# Patient Record
Sex: Male | Born: 1950 | Race: White | Hispanic: No | Marital: Married
Health system: Southern US, Community
[De-identification: ages and names within clinical notes are randomized; demographics above are authoritative.]

## PROBLEM LIST (undated history)

## (undated) DIAGNOSIS — I10 Essential (primary) hypertension: Secondary | ICD-10-CM

---

## 2005-05-14 ENCOUNTER — Inpatient Hospital Stay (HOSPITAL_COMMUNITY): Admission: EM | Admit: 2005-05-14 | Discharge: 2005-05-19 | Payer: Self-pay | Admitting: Emergency Medicine

## 2005-05-14 ENCOUNTER — Ambulatory Visit: Payer: Self-pay | Admitting: Hospitalist

## 2005-05-22 ENCOUNTER — Emergency Department (HOSPITAL_COMMUNITY): Admission: EM | Admit: 2005-05-22 | Discharge: 2005-05-23 | Payer: Self-pay | Admitting: Emergency Medicine

## 2005-05-23 ENCOUNTER — Inpatient Hospital Stay (HOSPITAL_COMMUNITY): Admission: AD | Admit: 2005-05-23 | Discharge: 2005-05-26 | Payer: Self-pay | Admitting: Internal Medicine

## 2005-05-23 ENCOUNTER — Ambulatory Visit: Payer: Self-pay | Admitting: Internal Medicine

## 2005-05-24 ENCOUNTER — Encounter (INDEPENDENT_AMBULATORY_CARE_PROVIDER_SITE_OTHER): Payer: Self-pay | Admitting: *Deleted

## 2005-05-30 ENCOUNTER — Ambulatory Visit: Payer: Self-pay | Admitting: Hospitalist

## 2005-06-02 ENCOUNTER — Ambulatory Visit (HOSPITAL_COMMUNITY): Admission: RE | Admit: 2005-06-02 | Discharge: 2005-06-02 | Payer: Self-pay | Admitting: *Deleted

## 2005-06-06 ENCOUNTER — Ambulatory Visit: Payer: Self-pay | Admitting: Hospitalist

## 2005-06-06 ENCOUNTER — Ambulatory Visit (HOSPITAL_COMMUNITY): Admission: RE | Admit: 2005-06-06 | Discharge: 2005-06-06 | Payer: Self-pay | Admitting: Hospitalist

## 2005-06-13 ENCOUNTER — Ambulatory Visit: Payer: Self-pay | Admitting: Internal Medicine

## 2005-06-27 ENCOUNTER — Ambulatory Visit: Payer: Self-pay | Admitting: Hospitalist

## 2006-03-27 IMAGING — PT NM PET TUM IMG SKULL BASE T - THIGH
4 series · 25 of 25 positions shown · non-contrast
Comparison: The patient had a CT scan on 05/23/05 with a history of pulmonary emboli.

CLINICAL DATA: Patient has a pulmonary nodule in the right middle lobe.
 FDG PET-CT TUMOR IMAGING (SKULL BASE TO THIGHS):
 Fasting Blood Glucose:  109.
TECHNIQUE: 16.8 mCi F-18 FDG were administered via the right hand.  Full ring PET imaging was performed from the skull base through the mid-thighs 48 minutes after injection.  CT data was obtained and used for attenuation correction and anatomic localization only.  (This was not acquired as a diagnostic CT examination).

[Series 1: pet ac · axial · 3.3mm · 4.69mm/px · z∈[-870,+0]mm · 8 of 267 slices shown]
[im 1/267]
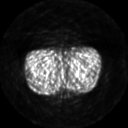
[im 39/267]
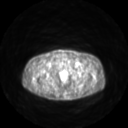
[im 77/267]
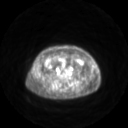
[im 115/267]
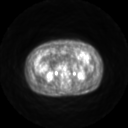
[im 153/267]
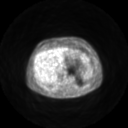
[im 191/267]
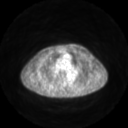
[im 229/267]
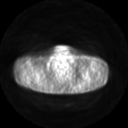
[im 267/267]
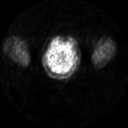

[Series 2: pet nac · axial · 3.3mm · 4.69mm/px · z∈[-870,+0]mm · 8 of 267 slices shown]
[im 1/267]
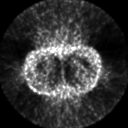
[im 39/267]
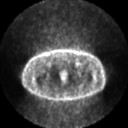
[im 77/267]
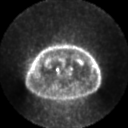
[im 115/267]
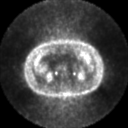
[im 153/267]
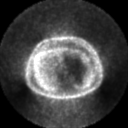
[im 191/267]
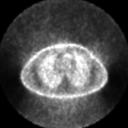
[im 229/267]
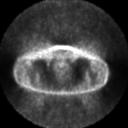
[im 267/267]
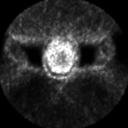

[Series 2: ct images · axial · 3.8mm · 0.98mm/px · z∈[-870,+0]mm · 8 of 254 slices shown]
[im 1/254]
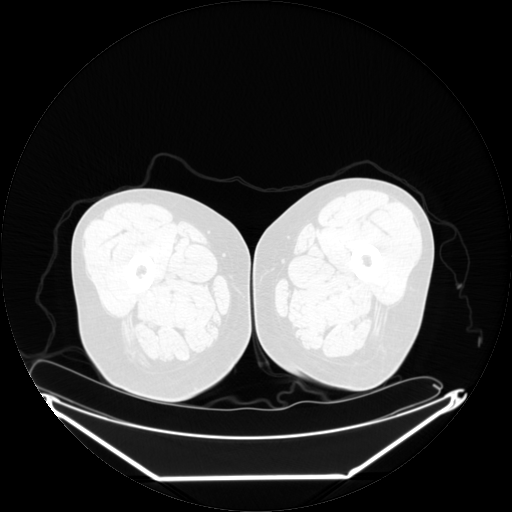
[im 37/254]
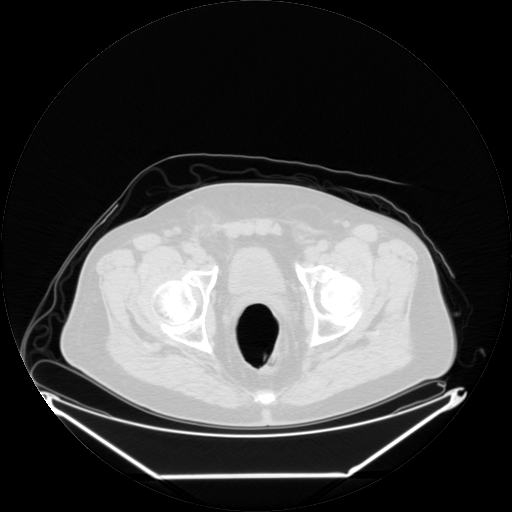
[im 73/254]
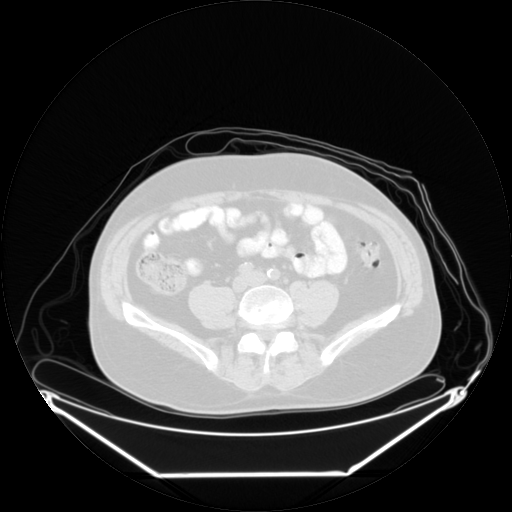
[im 109/254]
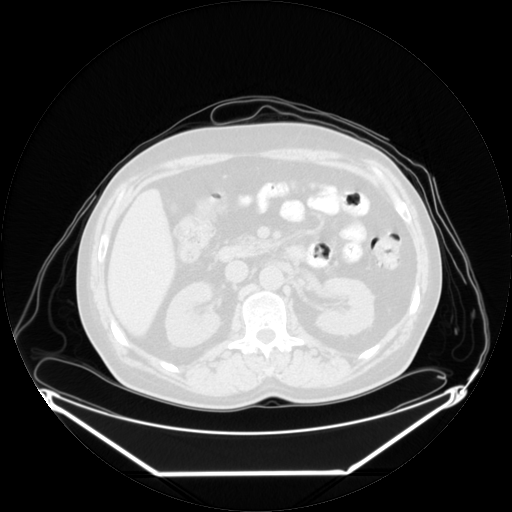
[im 145/254]
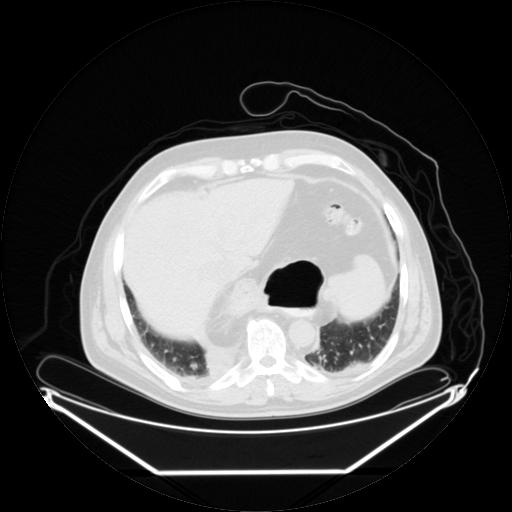
[im 181/254]
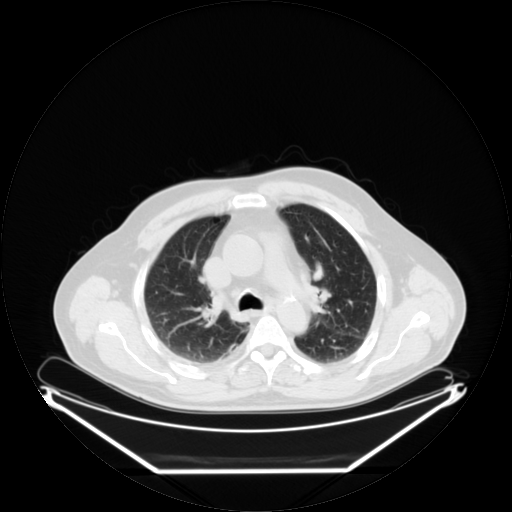
[im 217/254  brain]
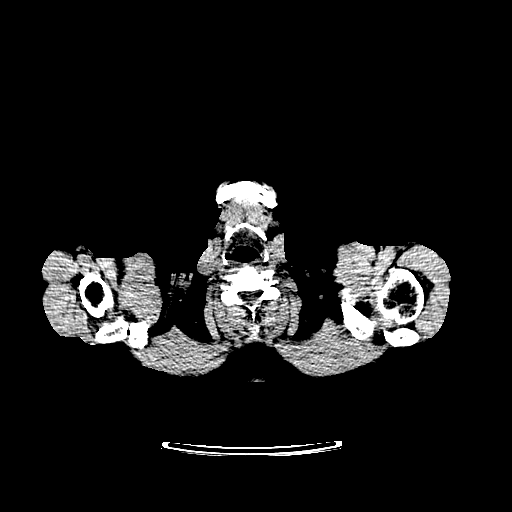
[im 254/254  brain]
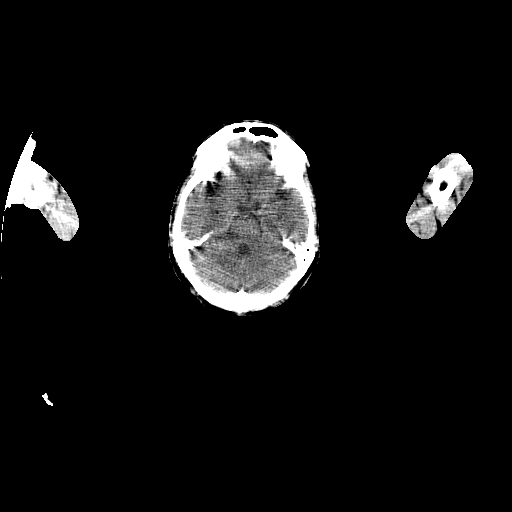

[Series 123: mip · coronal · 3.3mm · 4.69mm/px · 1 of 30 slices shown]
[im 1/30]
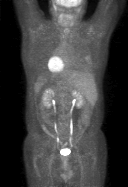

[25 of 25 positions shown; findings below may reference images not displayed]

A 13-mm nodule is noted in the lateral portion of the right middle lobe.  The patient had thoracentesis on 05/24/05.
FINDINGS: No abnormal activity in the neck.  There is mild focal increase in FDG activity in the lower pole of the left lobe of the thyroid.  No definite focal mass is noted on the unenhanced CT scan. 
 No abnormal activity is identified within the chest.  Particularly, no abnormal activity corresponds to the nodule in the lateral portion of the right mid lung field.  No hilar or mediastinal abnormal activity.  No abnormal activity in the liver or pelvis.  Large hiatal hernia is visualized.
IMPRESSION: 1.  No abnormal FDG activity corresponds to the 13-mm nodule in the lateral portion of the right mid lung field. 
 2.  There is minimal increase in FDG activity in the left lobe of the thyroid without a definite mass being noted on the CT scan.

## 2008-03-05 ENCOUNTER — Encounter: Admission: RE | Admit: 2008-03-05 | Discharge: 2008-03-05 | Payer: Self-pay | Admitting: Cardiology

## 2008-11-12 ENCOUNTER — Encounter: Admission: RE | Admit: 2008-11-12 | Discharge: 2008-11-12 | Payer: Self-pay | Admitting: Occupational Medicine

## 2008-11-26 ENCOUNTER — Encounter: Admission: RE | Admit: 2008-11-26 | Discharge: 2009-01-06 | Payer: Self-pay | Admitting: Family Medicine

## 2011-01-14 NOTE — Discharge Summary (Signed)
NAMEGHAZI, RUMPF NO.:  [id number]   MEDICAL RECORD NO.:  [id number]          PATIENT TYPE:  INP   LOCATION:  4705                         FACILITY:  [facility]   PHYSICIAN:  Eliseo Gum, M.D.   DATE OF BIRTH:  [birth year]   DATE OF ADMISSION:  05/23/2005  DATE OF DISCHARGE:  05/26/2005                                 DISCHARGE SUMMARY   CHIEF COMPLAINT:  Shortness of breath.   PRIMARY CARE PHYSICIAN:  [facility]   DISCHARGE DIAGNOSES:  1.  Right side pleural effusion.  2.  History of bilateral pulmonary embolus.  3.  Right middle lobe nodule.  4.  Hypothyroidism.   DISCHARGE MEDICATIONS:  1.  __________ 90 mg q. 12 hours.  2.  Levothyroxine 15 mcg daily.  3.  Avelox 400 mg daily x4 days.  4.  Protonix 40 mg daily.  5.  Warfarin 10 mg daily.  6.  Albuterol 2 puffs q.4 hours as needed.   Benjamin Shah was discharged from the hospital with good resolution of  shortness of breath. He was in the process of being anticoagulated with  Coumadin and is bridged with Lovenox. His pulmonary effusion was drained on  admission with 60 mL of fluid removed. He will followup as an outpatient for  further evaluation of his right middle lobe nodule. He will followup with  Dr. Guadlupe Spanish to further treat his anticoagulation requirement.   PROCEDURE:  Ultrasound guided thoracentesis with 60 mL of fluid removed. CT  angiogram on admission revealing right pleural effusion and right middle  lobe nodule. Chest x-ray following thoracentesis revealing resolution of  pleural effusion.   HISTORY OF PRESENT ILLNESS:  [name] is a 60 year old gentleman who  presented to hospital followup clinica with complaint of worsening shortness  of breath. The shortness of breath occurred after discharge from the  hospital four days prior whereupon he was treated for bilateral pneumonia  and bilateral pulmonary embolus. On presentation, it was determined that his  shortness of breath warranted  imaging and CT angiogram was obtained which  showed a pleural effusion. This pleural effusion was drained upon the  patient's admission.   PHYSICAL EXAMINATION:  VITAL SIGNS:  Temperature 98.4, pulse 73,  respirations 20, blood pressure 131/87, O2 saturations 94% on room air.  Breath sounds were decreased over the right lower lobe on exam.   Exam was otherwise unremarkable.   LABORATORY DATA ON ADMISSION:  White blood cells 7.2, hemoglobin 12.7,  hematocrit 38, platelets 627. Sodium 135, potassium 3.9, chloride 103. CO2  26, BUN 10, creatinine 0.9, glucose 120. Bilirubin 0.4, ALP 77, AST 28, ALT  29. Total protein 6.8, albumin 3.2, calcium 8.6. CK 41, CK-MB 0.3. Troponin  I is 0.02.   HOSPITAL COURSE BY PROBLEM:  #1.  RIGHT PLEURAL EFFUSION.  Ultrasound guided  thoracentesis was performed with 60 mL of fluid removed. Fluid was amber and  hazy with a white blood cell count of 12,300 and a glucose of 95, total  protein of 4.2, amylase 28. The fluid was determined to likely represent an  exudate with heavy eosinophilia present.  His exudate was determined also  likely not to be infectious but the patient was restarted on antibiotics in  the event that his pneumonia was still resolving. Over the course of this  hospitalization, the patient had no return of symptoms, no chest pain, no  shortness of breath.  #2.  HISTORY OF BILATERAL PULMONARY EMBOLI.  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 The  patient is currently receiving Coumadin 10 mg a day with Lovenox 90 mg twice  a day. Upon discharge, the patient's INR was 1.9. The patient will followup  in four days with Dr. Guadlupe Spanish __________. Goal INR will be 2.5 to 3.  #3.  RIGHT MIDDLE LOBE NODULE.  This nodule seen on CT angiogram measures  1.3 cm and was present on prior CT angiogram during prior hospitalization,  unchanged since that time. After conversations with CVTS, it was determined   that a PET scan would be the fastest way to rule out malignancy and would  preclude the necessity for surgical removal of this lesion in the near  future. We have scheduled PET scan at [facility] for October 5. The patient  will also followup in [facility] to evaluate the results of this PET scan.  #4.  HYPOTHYROIDISM.  This condition was diagnosed on admission during prior  hospitalization 2 weeks ago. The patient is now taking Synthroid and will  followup on TSH in clinic.      Judie Grieve, MD    ______________________________  Eliseo Gum, M.D.    SY/MEDQ  D:  05/26/2005  T:  05/27/2005  Job:  [id number]   cc:   Jaclyn Prime. Lucas Mallow, M.D.  Fax: [phone]   [facility]

## 2011-01-14 NOTE — Discharge Summary (Signed)
NAMEHILLARD, Benjamin Shah NO.:  [id number]   MEDICAL RECORD NO.:  [id number]          PATIENT TYPE:  INP   LOCATION:  2024                         FACILITY:  [facility]   PHYSICIAN:  Eliseo Gum, M.D.   DATE OF BIRTH:  06/13/1951   DATE OF ADMISSION:  05/14/2005  DATE OF DISCHARGE:  05/19/2005                                 DISCHARGE SUMMARY   DISCHARGE DIAGNOSES:  1.  Bilateral pulmonary emboli with hemoptysis and positive lupus      anticoagulant.  2.  Hypothyroidism.  3.  Pneumonia with pleural effusion.  4.  Normocytic anemia.  5.  Tobacco abuse.  6.  Heme-positive stools.   DISCHARGE MEDICATIONS:  1.  Coumadin 5 mg p.o. on Friday, 7.5 mg on Saturday, and 5 mg on Sunday      each at 6 p.m. in the evening.  2.  Synthroid 15 mcg p.o. daily.  3.  Albuterol metered dose inhaler two puffs q.4h. p.r.n.  4.  Lovenox 90 mg subcutaneous injections q.12h. Friday, Saturday and      Sunday, then stop.  5.  Levaquin 500 mg p.o. daily for seven days.  6.  Coumadin 5 mg p.o. daily and each day thereafter starting on Monday to      be dose adjusted by Dr. Michaell Cowing in the Coumadin clinic.   FOLLOW UP:  The patient is to follow up with Dr. Shannan Harper in the outpatient  clinic on Monday at 2:30 p.m. and Dr. Michaell Cowing in the Coumadin clinic on Monday  at 2 p.m. at which time the patient's Coumadin will be adjusted based on his  INR.  The patient's INR will need to be followed very closely and the  patient will likely need lifetime anticoagulation secondary to his being  positive for lupus anticoagulant.  The patient will also need a GI work-up  as an outpatient.  He is age-appropriate for a colonoscopy and also has  normocytic anemia as well as heme-positive stools.  However, he needs to be  on anticoagulation.  Therefore, the Coumadin and Lovenox were not stopped in  spite of the heme-positive stool.   CONDITION ON DISCHARGE:  The patient was discharged in stable and improved  condition.  His shortness of breath has greatly improved.  He is still  having hemoptysis. However, he had been oxygenating well including with  exercise.   OPERATION/PROCEDURE:  1.  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  Right basilar      atelectasis/infiltrate.  Small bilateral pleural effusions.  2.  Chest x-ray on May 15, 2005 showed bilateral pleural effusion,      right greater than left, with bilateral opacities, right greater than      left.  Cardiomegaly.  Large hiatal hernia.  3.  Chest x-ray on May 20, 2005 showed improving small pleural      effusion and peripheral air space opacities in the lung bases, right  greater than left.   CONSULTATIONS:  None.   ADMISSION HISTORY AND PHYSICAL:  Please see the chart for full details.  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  A week before admission  he drove from [place], [place] to [place], [place] where he stayed the  night.  He began experiencing sharp chest pain, a cough, shortness of  breath, and hemoptysis at that time.  The next day he went to the emergency  room in [place], [place] where he was diagnosed with pneumonia and received  prescriptions for antibiotics.  He then drove from [place] back to  [place] where the shortness of breath got worse and prompted him to go to  an Urgent Care Center.  They referred him to [facility] based on the chest x-  ray that was concerning for pulmonary embolus.   Past history is also noted for the fact that he experienced transient left  lower leg swelling and pain several weeks ago during a long cross-country  trip.  His present right-sided chest pain was  sharp and worsened with  movement.  The pain is not recreated with palpation.  The patient had  improved somewhat since the week prior and his hemoptysis has been fairly  unchanged since it began with bright red blood at night.   PHYSICAL EXAMINATION:  VITAL SIGNS:  Temperature 99.1, blood pressure  114/78, pulse 88, respirations 24, oxygen saturation 90% on room air which  increased to 95% on 4 L of oxygen nasal cannula.  GENERAL:  He was resting comfortably in no acute distress.  HEENT:  Extraocular movements were intact.  Pupils were equally round and  reactive to light and accommodation. ENT and oropharynx showed a left  tonsillar red, raised area.  NECK:  Supple with no thyromegaly.  LUNGS:  On the right side had rhonchi with decreased breath sounds over  right middle and lower lobes and the left lower lobe.  However, no wheezing  or crackles were noted.  CARDIOVASCULAR:  Regular rate and rhythm.  No murmurs.  ABDOMEN:  Positive bowel sounds.  Soft, nontender, nondistended.  EXTREMITIES:  2+ upper and lower extremity pulses.  No edema.  RECTAL:  Good rectal tone. No hemorrhoids.  Light brown stool guaiac-  positive.  SKIN:  No rashes lesions.  LYMPH NODES:  No cervical lymphadenopathy.  MUSCULOSKELETAL: 5/5 strength upper and lower extremities.  NEUROLOGIC:  Cranial nerves II-XII grossly intact.  Cycles appropriate.   LABORATORY DATA ON ADMISSION:  An ABG on 4 L per minute of oxygen via nasal  cannula showed a PH of 7.407, PCO2 39.3, PO2 96.7, bicarb 24.2.  Oxygen  saturation 97.5.  WBC 9.4, hemoglobin 11.5, hematocrit 34, MCV 89.5,  platelets 223.  Absolute neutrophils were 6.5.  Peripheral blood smear  showed slight toxic granulation, few vacuolated neutrophils, and few  atypical lymphocytes.  Percent retics 0.8, RBC 3.9, absolute retics 31.2.  PT 16.2, INR 1.3.  Antithrombin III was 74 which is low.  Factor V Leiden mutation was negative.  Lupus anticoagulant was detected.   Protein C was low  at 59.  Protein S was normal at 87.  Protein S functional was low at 55.  Protein C functional was low at 90.  Sodium 135, potassium 3.5, chloride  102, bicarb 26, glucose 97, BUN 4, creatinine 0.8, calcium 8.2, total  protein 6.3, albumin 2.8, AST 37, ALT 22, alk phos 57, total bilirubin 1.3,  magnesium 2.3, LDH 123, homocysteine level 9.38 and normal.  Hemoglobin A1c  5.5 and normal.  CK 27, CK-MB 0.4, troponin-I 0.01.  Fasting lipid panel  showed a cholesterol of 126, triglyceride 76, HDL 24, LDL 87, TSH 26.287,  free T4 and repeated TSH was 19.69, free T4 0.85.  Iron studies showed an  iron of 12, TIBC 282, percent saturation 4, B12 374, ferritin 147, IBC  folate 320.  Haptoglobin was 91.  Blood cultures drawn on the day of  admission showed no growth for 5 days.  Sputum cultures grew normal  pharyngeal flora.  Cardiolipin antibody was 21 and normal.  B2GLY, IgG/IgM  were normal.  Prothrombin 2 gene mutation was negative.   HOSPITAL COURSE:  Problem 1. Bilateral pulmonary emboli with hemoptysis and  positive lupus anticoagulation:  Given the patient's symptoms on  presentation, he had a high pretest probability of having a pulmonary  embolus and it was shown on CT angio on his chest that he had bilateral  pulmonary emboli with most likely resultant lower lobe pneumonia with  pleural effusion secondary also to the pulmonary emboli.  The patient's  hypocoagulable studies are listed previously under the history and physical  labs.  However, in summary the patient was found to be lupus anticoagulation  positive. Therefore, will likely need lifetime anticoagulation with  Coumadin.  The patient was started on low-molecular weight heparin on the  day of admission as well as warfarin and was continued on the low-molecular  weight heparin as a bridge until the INR was therapeutic on the Coumadin.  The patient's shortness of breath, pleuritic chest pain and hemoptysis  slowly  improved as his hospital course went on.  On the day of discharge he  was saturating well on room air and was able to walk around on the floor  without dropping his oxygen saturation levels.  He was discharged with a  prescription for and instructions for injecting himself with Lovenox, low-  molecular weight heparin every 12 hours through the weekend with appropriate  instructions to continue taking his Coumadin and will be seen on Monday in  the outpatient clinic and the Coumadin clinic for close followup on his INR  and levels.  The patient continued to have hemoptysis until the day of  discharge.  However, it had vastly improved since the day of admission, and  he was no longer short of breath and he was receiving approximately  treatment for his pulmonary emboli and was, therefore, discharged with close  followup three days after being discharge.   Problem 2. Hypothyroidism:  The patient did not carry a previous history of hypothyroidism.  This was discovered during this hospitalization.  The TSH  was checked initially and found to be high.  It was repeated and found to be  high still with a low free T4.  Therefore, the diagnosis of hypothyroidism  was made and the patient was started on Synthroid at 50 mcg p.o. daily.  This should be followed as an outpatient and his dose of Synthroid should be  adjusted as needed according to the TSH and free T4 levels.   Problem 3. Pneumonia with pleural effusion:  This is most likely secondary  to the pulmonary embolus infarcting tissue and allowing a nidus for  infection.  The patient was empirically started on antibiotics and was  discharged on a course of antibiotics to help  further resolve the pneumonia  seen on chest x-ray.  The patient remained afebrile during his  hospitalization and did not have a leukocytosis at all during this  hospitalization and was, therefore, discharged on Levaquin 500 mg p.o. daily  for seven more days to  empirically treat the pneumonia seen on chest x-ray.  A pleural effusion was also most likely secondary to the pulmonary emboli  and was slowly resolving as noted on his chest x-ray from May 18, 2005.  It may be considered to have a followup chest x-ray in the outpatient  setting to monitor the improvement of the air space opacities.   Problem 4. Normocytic anemia:  The patient did not carry a diagnosis of  anemia before being admitted to the hospital.  His hemoglobin was 11.5 on  admission but then MCV of 89.5.  His hemoglobin stayed stable in the 11-12  range.  He was found to be heme-positive on exam and his iron studies were  consistent with a possible iron-deficiency anemia and the patient should be  started on iron supplementation as an outpatient and should also be  scheduled for a colonoscopy as he is age-appropriate for iron-deficiency  anemia in order to rule out malignancy.  The patient's anticoagulation,  however, should be continued in light of the lupus anticoagulant being  positive with bilateral pleural effusions and the risk of him having another  PE completely outweighs the risk of anticoagulation.   Problem 5. Tobacco abuse:  The patient came in with a history of tobacco  abuse of about a pack a day for 35 years.  However, he had not smoked for  about a week prior to admission and did not smoke throughout his  hospitalization.  He received tobacco cessation counseling and was agreeable  to quitting and this should be followed as an outpatient and the patient  should be encouraged to continue to stop smoking.   Problem 6. Heme-positive stool:  This was found on the day of admission with  a rectal exam.  He was guaiac-positive and he does have an iron-deficiency  anemia and the patient is 60 years old.  Therefore, he is age-appropriate  for colonoscopy screening and this should be scheduled for the patient in  the outpatient setting.  DISCHARGE LABORATORY DATA:   On the day of discharge his white count was 6.3,  hemoglobin 11.4, hematocrit 33.4, MCV 88.7, platelets 428.  PT 26, INR 2.4.  Sodium 139, potassium 4.1, chloride 107, bicarb 26, glucose 100, BUN 4,  creatinine 0.8, calcium 8.5.  Please see the admission history and physical  lab section for a summary of all labs done during this hospitalization.   CONDITION ON DISCHARGE:  On discharge the patient's vital signs were stable.  His temperature was 97.8, pulse 79, respiratory rate 20, blood pressure  120/77 and he was satting at 96% on room air.   PENDING LABORATORY DATA:  None.      Chauncey Reading, M.D.    ______________________________  Eliseo Gum, M.D.    EA/MEDQ  D:  07/07/2005  T:  07/07/2005  Job:  [id number]   cc:   Judie Grieve, MD  Fax: [phone]

## 2012-05-23 ENCOUNTER — Ambulatory Visit: Payer: Self-pay

## 2012-05-23 ENCOUNTER — Other Ambulatory Visit: Payer: Self-pay | Admitting: Family Medicine

## 2012-05-23 DIAGNOSIS — M542 Cervicalgia: Secondary | ICD-10-CM

## 2012-05-30 ENCOUNTER — Ambulatory Visit: Payer: Worker's Compensation | Attending: Physician Assistant | Admitting: Physical Therapy

## 2012-05-30 DIAGNOSIS — M2560 Stiffness of unspecified joint, not elsewhere classified: Secondary | ICD-10-CM | POA: Insufficient documentation

## 2012-05-30 DIAGNOSIS — M6281 Muscle weakness (generalized): Secondary | ICD-10-CM | POA: Insufficient documentation

## 2012-05-30 DIAGNOSIS — M546 Pain in thoracic spine: Secondary | ICD-10-CM | POA: Insufficient documentation

## 2012-05-30 DIAGNOSIS — IMO0001 Reserved for inherently not codable concepts without codable children: Secondary | ICD-10-CM | POA: Insufficient documentation

## 2012-05-30 DIAGNOSIS — M542 Cervicalgia: Secondary | ICD-10-CM | POA: Insufficient documentation

## 2012-05-31 ENCOUNTER — Ambulatory Visit: Payer: Worker's Compensation | Attending: Physician Assistant | Admitting: Physical Therapy

## 2012-05-31 DIAGNOSIS — M546 Pain in thoracic spine: Secondary | ICD-10-CM | POA: Insufficient documentation

## 2012-05-31 DIAGNOSIS — M542 Cervicalgia: Secondary | ICD-10-CM | POA: Insufficient documentation

## 2012-05-31 DIAGNOSIS — M6281 Muscle weakness (generalized): Secondary | ICD-10-CM | POA: Insufficient documentation

## 2012-05-31 DIAGNOSIS — IMO0001 Reserved for inherently not codable concepts without codable children: Secondary | ICD-10-CM | POA: Insufficient documentation

## 2012-05-31 DIAGNOSIS — M2560 Stiffness of unspecified joint, not elsewhere classified: Secondary | ICD-10-CM | POA: Insufficient documentation

## 2012-06-05 ENCOUNTER — Ambulatory Visit: Payer: Worker's Compensation | Attending: Family Medicine | Admitting: Physical Therapy

## 2012-06-05 DIAGNOSIS — IMO0001 Reserved for inherently not codable concepts without codable children: Secondary | ICD-10-CM | POA: Insufficient documentation

## 2012-06-05 DIAGNOSIS — M6281 Muscle weakness (generalized): Secondary | ICD-10-CM | POA: Insufficient documentation

## 2012-06-05 DIAGNOSIS — M546 Pain in thoracic spine: Secondary | ICD-10-CM | POA: Insufficient documentation

## 2012-06-05 DIAGNOSIS — M2560 Stiffness of unspecified joint, not elsewhere classified: Secondary | ICD-10-CM | POA: Insufficient documentation

## 2012-06-05 DIAGNOSIS — M542 Cervicalgia: Secondary | ICD-10-CM | POA: Insufficient documentation

## 2012-06-06 ENCOUNTER — Other Ambulatory Visit: Payer: Self-pay | Admitting: Sports Medicine

## 2012-06-06 ENCOUNTER — Ambulatory Visit: Payer: Worker's Compensation | Admitting: Physical Therapy

## 2012-06-06 ENCOUNTER — Ambulatory Visit (INDEPENDENT_AMBULATORY_CARE_PROVIDER_SITE_OTHER): Payer: Worker's Compensation

## 2012-06-06 DIAGNOSIS — M546 Pain in thoracic spine: Secondary | ICD-10-CM

## 2012-06-06 DIAGNOSIS — X58XXXA Exposure to other specified factors, initial encounter: Secondary | ICD-10-CM

## 2012-06-08 ENCOUNTER — Ambulatory Visit: Payer: Worker's Compensation | Admitting: Physical Therapy

## 2012-06-11 ENCOUNTER — Ambulatory Visit: Payer: Worker's Compensation | Attending: Physician Assistant | Admitting: Physical Therapy

## 2012-06-11 DIAGNOSIS — M2560 Stiffness of unspecified joint, not elsewhere classified: Secondary | ICD-10-CM | POA: Insufficient documentation

## 2012-06-11 DIAGNOSIS — M546 Pain in thoracic spine: Secondary | ICD-10-CM | POA: Insufficient documentation

## 2012-06-11 DIAGNOSIS — IMO0001 Reserved for inherently not codable concepts without codable children: Secondary | ICD-10-CM | POA: Insufficient documentation

## 2012-06-11 DIAGNOSIS — M542 Cervicalgia: Secondary | ICD-10-CM | POA: Insufficient documentation

## 2012-06-11 DIAGNOSIS — M6281 Muscle weakness (generalized): Secondary | ICD-10-CM | POA: Insufficient documentation

## 2012-06-13 ENCOUNTER — Ambulatory Visit: Payer: Worker's Compensation | Admitting: Physical Therapy

## 2012-06-15 ENCOUNTER — Ambulatory Visit: Payer: Worker's Compensation | Attending: Physician Assistant | Admitting: Physical Therapy

## 2012-06-15 DIAGNOSIS — M546 Pain in thoracic spine: Secondary | ICD-10-CM | POA: Insufficient documentation

## 2012-06-15 DIAGNOSIS — M6281 Muscle weakness (generalized): Secondary | ICD-10-CM | POA: Insufficient documentation

## 2012-06-15 DIAGNOSIS — M542 Cervicalgia: Secondary | ICD-10-CM | POA: Insufficient documentation

## 2012-06-15 DIAGNOSIS — IMO0001 Reserved for inherently not codable concepts without codable children: Secondary | ICD-10-CM | POA: Insufficient documentation

## 2012-06-15 DIAGNOSIS — M2560 Stiffness of unspecified joint, not elsewhere classified: Secondary | ICD-10-CM | POA: Insufficient documentation

## 2012-06-18 ENCOUNTER — Ambulatory Visit: Payer: Worker's Compensation | Attending: Physician Assistant | Admitting: Physical Therapy

## 2012-06-18 DIAGNOSIS — M542 Cervicalgia: Secondary | ICD-10-CM | POA: Insufficient documentation

## 2012-06-18 DIAGNOSIS — M546 Pain in thoracic spine: Secondary | ICD-10-CM | POA: Insufficient documentation

## 2012-06-18 DIAGNOSIS — M2560 Stiffness of unspecified joint, not elsewhere classified: Secondary | ICD-10-CM | POA: Insufficient documentation

## 2012-06-18 DIAGNOSIS — M6281 Muscle weakness (generalized): Secondary | ICD-10-CM | POA: Insufficient documentation

## 2012-06-18 DIAGNOSIS — IMO0001 Reserved for inherently not codable concepts without codable children: Secondary | ICD-10-CM | POA: Insufficient documentation

## 2012-06-20 ENCOUNTER — Ambulatory Visit: Payer: Worker's Compensation | Attending: Physician Assistant

## 2012-06-20 DIAGNOSIS — M542 Cervicalgia: Secondary | ICD-10-CM | POA: Insufficient documentation

## 2012-06-20 DIAGNOSIS — M2560 Stiffness of unspecified joint, not elsewhere classified: Secondary | ICD-10-CM | POA: Insufficient documentation

## 2012-06-20 DIAGNOSIS — IMO0001 Reserved for inherently not codable concepts without codable children: Secondary | ICD-10-CM | POA: Insufficient documentation

## 2012-06-20 DIAGNOSIS — M6281 Muscle weakness (generalized): Secondary | ICD-10-CM | POA: Insufficient documentation

## 2012-06-20 DIAGNOSIS — M546 Pain in thoracic spine: Secondary | ICD-10-CM | POA: Insufficient documentation

## 2012-06-25 ENCOUNTER — Ambulatory Visit: Payer: Worker's Compensation | Admitting: Physical Therapy

## 2012-06-26 ENCOUNTER — Ambulatory Visit: Payer: Worker's Compensation | Attending: Physician Assistant | Admitting: Physical Therapy

## 2012-06-26 DIAGNOSIS — M542 Cervicalgia: Secondary | ICD-10-CM | POA: Insufficient documentation

## 2012-06-26 DIAGNOSIS — M546 Pain in thoracic spine: Secondary | ICD-10-CM | POA: Insufficient documentation

## 2012-06-26 DIAGNOSIS — M6281 Muscle weakness (generalized): Secondary | ICD-10-CM | POA: Insufficient documentation

## 2012-06-26 DIAGNOSIS — IMO0001 Reserved for inherently not codable concepts without codable children: Secondary | ICD-10-CM | POA: Insufficient documentation

## 2012-06-26 DIAGNOSIS — M2560 Stiffness of unspecified joint, not elsewhere classified: Secondary | ICD-10-CM | POA: Insufficient documentation

## 2012-06-27 ENCOUNTER — Encounter: Payer: Self-pay | Admitting: Physical Therapy

## 2012-07-02 ENCOUNTER — Ambulatory Visit: Payer: Worker's Compensation | Attending: Physician Assistant | Admitting: Physical Therapy

## 2012-07-02 DIAGNOSIS — M546 Pain in thoracic spine: Secondary | ICD-10-CM | POA: Insufficient documentation

## 2012-07-02 DIAGNOSIS — IMO0001 Reserved for inherently not codable concepts without codable children: Secondary | ICD-10-CM | POA: Insufficient documentation

## 2012-07-02 DIAGNOSIS — M6281 Muscle weakness (generalized): Secondary | ICD-10-CM | POA: Insufficient documentation

## 2012-07-02 DIAGNOSIS — M542 Cervicalgia: Secondary | ICD-10-CM | POA: Insufficient documentation

## 2012-07-02 DIAGNOSIS — M2560 Stiffness of unspecified joint, not elsewhere classified: Secondary | ICD-10-CM | POA: Insufficient documentation

## 2012-07-04 ENCOUNTER — Ambulatory Visit: Payer: Worker's Compensation | Attending: Physician Assistant | Admitting: Physical Therapy

## 2012-07-04 DIAGNOSIS — IMO0001 Reserved for inherently not codable concepts without codable children: Secondary | ICD-10-CM | POA: Insufficient documentation

## 2012-07-04 DIAGNOSIS — M546 Pain in thoracic spine: Secondary | ICD-10-CM | POA: Insufficient documentation

## 2012-07-04 DIAGNOSIS — M542 Cervicalgia: Secondary | ICD-10-CM | POA: Insufficient documentation

## 2012-07-04 DIAGNOSIS — M6281 Muscle weakness (generalized): Secondary | ICD-10-CM | POA: Insufficient documentation

## 2012-07-04 DIAGNOSIS — M2560 Stiffness of unspecified joint, not elsewhere classified: Secondary | ICD-10-CM | POA: Insufficient documentation

## 2012-07-09 ENCOUNTER — Encounter: Payer: Self-pay | Admitting: Physical Therapy

## 2012-07-11 ENCOUNTER — Encounter: Payer: Self-pay | Admitting: Physical Therapy

## 2012-07-16 ENCOUNTER — Encounter: Payer: Self-pay | Admitting: Physical Therapy

## 2013-01-31 ENCOUNTER — Ambulatory Visit: Payer: Worker's Compensation | Admitting: Physical Therapy

## 2013-01-31 DIAGNOSIS — M6281 Muscle weakness (generalized): Secondary | ICD-10-CM

## 2013-01-31 DIAGNOSIS — M2560 Stiffness of unspecified joint, not elsewhere classified: Secondary | ICD-10-CM

## 2013-01-31 DIAGNOSIS — M4804 Spinal stenosis, thoracic region: Secondary | ICD-10-CM

## 2013-01-31 DIAGNOSIS — M546 Pain in thoracic spine: Secondary | ICD-10-CM

## 2013-01-31 DIAGNOSIS — IMO0002 Reserved for concepts with insufficient information to code with codable children: Secondary | ICD-10-CM

## 2013-02-04 ENCOUNTER — Encounter: Payer: Worker's Compensation | Admitting: Physical Therapy

## 2013-02-04 DIAGNOSIS — M2560 Stiffness of unspecified joint, not elsewhere classified: Secondary | ICD-10-CM

## 2013-02-04 DIAGNOSIS — M4804 Spinal stenosis, thoracic region: Secondary | ICD-10-CM

## 2013-02-04 DIAGNOSIS — M6281 Muscle weakness (generalized): Secondary | ICD-10-CM

## 2013-02-04 DIAGNOSIS — M546 Pain in thoracic spine: Secondary | ICD-10-CM

## 2013-02-06 ENCOUNTER — Encounter: Payer: Worker's Compensation | Admitting: Physical Therapy

## 2013-02-06 DIAGNOSIS — M2560 Stiffness of unspecified joint, not elsewhere classified: Secondary | ICD-10-CM

## 2013-02-06 DIAGNOSIS — M4804 Spinal stenosis, thoracic region: Secondary | ICD-10-CM

## 2013-02-06 DIAGNOSIS — M546 Pain in thoracic spine: Secondary | ICD-10-CM

## 2013-02-06 DIAGNOSIS — IMO0002 Reserved for concepts with insufficient information to code with codable children: Secondary | ICD-10-CM

## 2013-02-06 DIAGNOSIS — M6281 Muscle weakness (generalized): Secondary | ICD-10-CM

## 2013-02-08 ENCOUNTER — Encounter: Payer: Worker's Compensation | Admitting: Physical Therapy

## 2013-02-08 ENCOUNTER — Encounter: Payer: Self-pay | Admitting: Physical Therapy

## 2013-02-08 DIAGNOSIS — M6281 Muscle weakness (generalized): Secondary | ICD-10-CM

## 2013-02-08 DIAGNOSIS — M4804 Spinal stenosis, thoracic region: Secondary | ICD-10-CM

## 2013-02-08 DIAGNOSIS — M2560 Stiffness of unspecified joint, not elsewhere classified: Secondary | ICD-10-CM

## 2013-02-08 DIAGNOSIS — M546 Pain in thoracic spine: Secondary | ICD-10-CM

## 2013-02-11 ENCOUNTER — Encounter: Payer: Self-pay | Admitting: Physical Therapy

## 2013-02-11 ENCOUNTER — Encounter: Payer: Worker's Compensation | Admitting: Physical Therapy

## 2013-02-11 DIAGNOSIS — M2560 Stiffness of unspecified joint, not elsewhere classified: Secondary | ICD-10-CM

## 2013-02-11 DIAGNOSIS — M4804 Spinal stenosis, thoracic region: Secondary | ICD-10-CM

## 2013-02-11 DIAGNOSIS — M546 Pain in thoracic spine: Secondary | ICD-10-CM

## 2013-02-11 DIAGNOSIS — M6281 Muscle weakness (generalized): Secondary | ICD-10-CM

## 2013-02-11 DIAGNOSIS — IMO0002 Reserved for concepts with insufficient information to code with codable children: Secondary | ICD-10-CM

## 2013-02-13 ENCOUNTER — Encounter: Payer: Worker's Compensation | Admitting: Physical Therapy

## 2013-02-13 ENCOUNTER — Encounter: Payer: Self-pay | Admitting: Physical Therapy

## 2013-02-13 DIAGNOSIS — IMO0002 Reserved for concepts with insufficient information to code with codable children: Secondary | ICD-10-CM

## 2013-02-13 DIAGNOSIS — M4804 Spinal stenosis, thoracic region: Secondary | ICD-10-CM

## 2013-02-13 DIAGNOSIS — M2560 Stiffness of unspecified joint, not elsewhere classified: Secondary | ICD-10-CM

## 2013-02-13 DIAGNOSIS — M6281 Muscle weakness (generalized): Secondary | ICD-10-CM

## 2013-02-13 DIAGNOSIS — M546 Pain in thoracic spine: Secondary | ICD-10-CM

## 2013-02-15 ENCOUNTER — Encounter: Payer: Self-pay | Admitting: Physical Therapy

## 2013-02-15 ENCOUNTER — Encounter: Payer: Worker's Compensation | Admitting: Physical Therapy

## 2013-02-15 DIAGNOSIS — M2560 Stiffness of unspecified joint, not elsewhere classified: Secondary | ICD-10-CM

## 2013-02-15 DIAGNOSIS — IMO0002 Reserved for concepts with insufficient information to code with codable children: Secondary | ICD-10-CM

## 2013-02-15 DIAGNOSIS — M4804 Spinal stenosis, thoracic region: Secondary | ICD-10-CM

## 2013-02-15 DIAGNOSIS — M546 Pain in thoracic spine: Secondary | ICD-10-CM

## 2013-02-15 DIAGNOSIS — M6281 Muscle weakness (generalized): Secondary | ICD-10-CM

## 2013-02-18 ENCOUNTER — Encounter: Payer: Self-pay | Admitting: Physical Therapy

## 2013-02-18 ENCOUNTER — Encounter: Payer: Worker's Compensation | Admitting: Physical Therapy

## 2013-02-18 DIAGNOSIS — M2560 Stiffness of unspecified joint, not elsewhere classified: Secondary | ICD-10-CM

## 2013-02-18 DIAGNOSIS — M546 Pain in thoracic spine: Secondary | ICD-10-CM

## 2013-02-18 DIAGNOSIS — IMO0002 Reserved for concepts with insufficient information to code with codable children: Secondary | ICD-10-CM

## 2013-02-18 DIAGNOSIS — M6281 Muscle weakness (generalized): Secondary | ICD-10-CM

## 2013-02-20 ENCOUNTER — Encounter: Payer: Worker's Compensation | Admitting: Physical Therapy

## 2013-02-20 ENCOUNTER — Encounter: Payer: Self-pay | Admitting: Physical Therapy

## 2013-02-20 DIAGNOSIS — M6281 Muscle weakness (generalized): Secondary | ICD-10-CM

## 2013-02-20 DIAGNOSIS — M2560 Stiffness of unspecified joint, not elsewhere classified: Secondary | ICD-10-CM

## 2013-02-20 DIAGNOSIS — M4804 Spinal stenosis, thoracic region: Secondary | ICD-10-CM

## 2013-02-20 DIAGNOSIS — IMO0002 Reserved for concepts with insufficient information to code with codable children: Secondary | ICD-10-CM

## 2013-02-20 DIAGNOSIS — M546 Pain in thoracic spine: Secondary | ICD-10-CM

## 2013-02-22 ENCOUNTER — Encounter: Payer: Self-pay | Admitting: Physical Therapy

## 2013-02-22 ENCOUNTER — Encounter: Payer: Worker's Compensation | Admitting: Physical Therapy

## 2013-02-22 DIAGNOSIS — IMO0002 Reserved for concepts with insufficient information to code with codable children: Secondary | ICD-10-CM

## 2013-02-22 DIAGNOSIS — M2560 Stiffness of unspecified joint, not elsewhere classified: Secondary | ICD-10-CM

## 2013-02-22 DIAGNOSIS — M546 Pain in thoracic spine: Secondary | ICD-10-CM

## 2013-02-22 DIAGNOSIS — M6281 Muscle weakness (generalized): Secondary | ICD-10-CM

## 2013-02-22 DIAGNOSIS — M4804 Spinal stenosis, thoracic region: Secondary | ICD-10-CM

## 2013-02-25 ENCOUNTER — Encounter: Payer: Self-pay | Admitting: Physical Therapy

## 2013-02-25 ENCOUNTER — Encounter: Payer: Worker's Compensation | Admitting: Physical Therapy

## 2013-02-25 DIAGNOSIS — IMO0002 Reserved for concepts with insufficient information to code with codable children: Secondary | ICD-10-CM

## 2013-02-25 DIAGNOSIS — M6281 Muscle weakness (generalized): Secondary | ICD-10-CM

## 2013-02-25 DIAGNOSIS — M546 Pain in thoracic spine: Secondary | ICD-10-CM

## 2013-02-25 DIAGNOSIS — M4804 Spinal stenosis, thoracic region: Secondary | ICD-10-CM

## 2013-02-25 DIAGNOSIS — M2560 Stiffness of unspecified joint, not elsewhere classified: Secondary | ICD-10-CM

## 2013-02-27 ENCOUNTER — Encounter: Payer: Self-pay | Admitting: Physical Therapy

## 2013-02-27 ENCOUNTER — Encounter: Payer: Worker's Compensation | Admitting: Physical Therapy

## 2013-02-27 DIAGNOSIS — M6281 Muscle weakness (generalized): Secondary | ICD-10-CM

## 2013-02-27 DIAGNOSIS — IMO0002 Reserved for concepts with insufficient information to code with codable children: Secondary | ICD-10-CM

## 2013-02-27 DIAGNOSIS — M4804 Spinal stenosis, thoracic region: Secondary | ICD-10-CM

## 2013-02-27 DIAGNOSIS — M546 Pain in thoracic spine: Secondary | ICD-10-CM

## 2013-02-27 DIAGNOSIS — M2560 Stiffness of unspecified joint, not elsewhere classified: Secondary | ICD-10-CM

## 2013-02-28 ENCOUNTER — Encounter: Payer: Worker's Compensation | Admitting: Physical Therapy

## 2013-02-28 ENCOUNTER — Encounter: Payer: Self-pay | Admitting: Physical Therapy

## 2013-02-28 DIAGNOSIS — M6281 Muscle weakness (generalized): Secondary | ICD-10-CM

## 2013-02-28 DIAGNOSIS — M546 Pain in thoracic spine: Secondary | ICD-10-CM

## 2013-02-28 DIAGNOSIS — IMO0002 Reserved for concepts with insufficient information to code with codable children: Secondary | ICD-10-CM

## 2013-02-28 DIAGNOSIS — M2560 Stiffness of unspecified joint, not elsewhere classified: Secondary | ICD-10-CM

## 2013-02-28 DIAGNOSIS — M4804 Spinal stenosis, thoracic region: Secondary | ICD-10-CM

## 2013-03-04 ENCOUNTER — Encounter: Payer: Worker's Compensation | Admitting: Physical Therapy

## 2013-03-04 ENCOUNTER — Encounter: Payer: Self-pay | Admitting: Physical Therapy

## 2013-03-04 DIAGNOSIS — M546 Pain in thoracic spine: Secondary | ICD-10-CM

## 2013-03-04 DIAGNOSIS — M2560 Stiffness of unspecified joint, not elsewhere classified: Secondary | ICD-10-CM

## 2013-03-04 DIAGNOSIS — IMO0002 Reserved for concepts with insufficient information to code with codable children: Secondary | ICD-10-CM

## 2013-03-04 DIAGNOSIS — M6281 Muscle weakness (generalized): Secondary | ICD-10-CM

## 2013-03-04 DIAGNOSIS — M4804 Spinal stenosis, thoracic region: Secondary | ICD-10-CM

## 2013-03-06 ENCOUNTER — Encounter: Payer: Self-pay | Admitting: Physical Therapy

## 2013-03-06 ENCOUNTER — Encounter: Payer: Worker's Compensation | Admitting: Physical Therapy

## 2013-03-06 DIAGNOSIS — M6281 Muscle weakness (generalized): Secondary | ICD-10-CM

## 2013-03-06 DIAGNOSIS — M2560 Stiffness of unspecified joint, not elsewhere classified: Secondary | ICD-10-CM

## 2013-03-06 DIAGNOSIS — M4804 Spinal stenosis, thoracic region: Secondary | ICD-10-CM

## 2013-03-06 DIAGNOSIS — M546 Pain in thoracic spine: Secondary | ICD-10-CM

## 2013-03-06 DIAGNOSIS — IMO0002 Reserved for concepts with insufficient information to code with codable children: Secondary | ICD-10-CM

## 2013-03-08 ENCOUNTER — Encounter: Payer: Worker's Compensation | Admitting: Physical Therapy

## 2013-03-08 ENCOUNTER — Encounter: Payer: Self-pay | Admitting: Physical Therapy

## 2013-03-08 DIAGNOSIS — M4804 Spinal stenosis, thoracic region: Secondary | ICD-10-CM

## 2013-03-08 DIAGNOSIS — M6281 Muscle weakness (generalized): Secondary | ICD-10-CM

## 2013-03-08 DIAGNOSIS — IMO0002 Reserved for concepts with insufficient information to code with codable children: Secondary | ICD-10-CM

## 2013-03-08 DIAGNOSIS — M2560 Stiffness of unspecified joint, not elsewhere classified: Secondary | ICD-10-CM

## 2013-03-08 DIAGNOSIS — M546 Pain in thoracic spine: Secondary | ICD-10-CM

## 2013-03-11 ENCOUNTER — Encounter: Payer: Self-pay | Admitting: Physical Therapy

## 2013-03-13 ENCOUNTER — Encounter: Payer: Self-pay | Admitting: Physical Therapy

## 2013-03-13 ENCOUNTER — Encounter: Payer: Worker's Compensation | Admitting: Physical Therapy

## 2013-03-15 ENCOUNTER — Encounter: Payer: Self-pay | Admitting: Physical Therapy

## 2013-03-18 ENCOUNTER — Encounter: Payer: Worker's Compensation | Admitting: Physical Therapy

## 2013-03-18 DIAGNOSIS — M4804 Spinal stenosis, thoracic region: Secondary | ICD-10-CM

## 2013-03-18 DIAGNOSIS — M6281 Muscle weakness (generalized): Secondary | ICD-10-CM

## 2013-03-18 DIAGNOSIS — M546 Pain in thoracic spine: Secondary | ICD-10-CM

## 2013-03-18 DIAGNOSIS — IMO0002 Reserved for concepts with insufficient information to code with codable children: Secondary | ICD-10-CM

## 2013-03-18 DIAGNOSIS — M2560 Stiffness of unspecified joint, not elsewhere classified: Secondary | ICD-10-CM

## 2013-03-20 ENCOUNTER — Encounter: Payer: Worker's Compensation | Admitting: Physical Therapy

## 2013-03-20 DIAGNOSIS — M6281 Muscle weakness (generalized): Secondary | ICD-10-CM

## 2013-03-20 DIAGNOSIS — M4804 Spinal stenosis, thoracic region: Secondary | ICD-10-CM

## 2013-03-20 DIAGNOSIS — M546 Pain in thoracic spine: Secondary | ICD-10-CM

## 2013-03-20 DIAGNOSIS — IMO0002 Reserved for concepts with insufficient information to code with codable children: Secondary | ICD-10-CM

## 2013-03-20 DIAGNOSIS — M2560 Stiffness of unspecified joint, not elsewhere classified: Secondary | ICD-10-CM

## 2013-03-22 ENCOUNTER — Encounter: Payer: Self-pay | Admitting: Physical Therapy

## 2013-03-25 ENCOUNTER — Encounter: Payer: Worker's Compensation | Admitting: Physical Therapy

## 2013-03-25 DIAGNOSIS — IMO0002 Reserved for concepts with insufficient information to code with codable children: Secondary | ICD-10-CM

## 2013-03-25 DIAGNOSIS — M4804 Spinal stenosis, thoracic region: Secondary | ICD-10-CM

## 2013-03-25 DIAGNOSIS — M546 Pain in thoracic spine: Secondary | ICD-10-CM

## 2013-03-25 DIAGNOSIS — M6281 Muscle weakness (generalized): Secondary | ICD-10-CM

## 2013-03-25 DIAGNOSIS — M2560 Stiffness of unspecified joint, not elsewhere classified: Secondary | ICD-10-CM

## 2013-03-27 ENCOUNTER — Encounter: Payer: Worker's Compensation | Admitting: Physical Therapy

## 2013-03-27 DIAGNOSIS — M546 Pain in thoracic spine: Secondary | ICD-10-CM

## 2013-03-27 DIAGNOSIS — M4804 Spinal stenosis, thoracic region: Secondary | ICD-10-CM

## 2013-03-27 DIAGNOSIS — M6281 Muscle weakness (generalized): Secondary | ICD-10-CM

## 2013-03-27 DIAGNOSIS — M2560 Stiffness of unspecified joint, not elsewhere classified: Secondary | ICD-10-CM

## 2013-03-27 DIAGNOSIS — IMO0002 Reserved for concepts with insufficient information to code with codable children: Secondary | ICD-10-CM

## 2013-03-29 ENCOUNTER — Encounter: Payer: Worker's Compensation | Admitting: Physical Therapy

## 2013-03-29 DIAGNOSIS — M6281 Muscle weakness (generalized): Secondary | ICD-10-CM

## 2013-03-29 DIAGNOSIS — IMO0002 Reserved for concepts with insufficient information to code with codable children: Secondary | ICD-10-CM

## 2013-03-29 DIAGNOSIS — M4804 Spinal stenosis, thoracic region: Secondary | ICD-10-CM

## 2013-03-29 DIAGNOSIS — M2560 Stiffness of unspecified joint, not elsewhere classified: Secondary | ICD-10-CM

## 2013-03-29 DIAGNOSIS — M546 Pain in thoracic spine: Secondary | ICD-10-CM

## 2013-04-01 ENCOUNTER — Encounter: Payer: Worker's Compensation | Admitting: Physical Therapy

## 2013-04-01 DIAGNOSIS — M6281 Muscle weakness (generalized): Secondary | ICD-10-CM

## 2013-04-01 DIAGNOSIS — M546 Pain in thoracic spine: Secondary | ICD-10-CM

## 2013-04-01 DIAGNOSIS — IMO0002 Reserved for concepts with insufficient information to code with codable children: Secondary | ICD-10-CM

## 2013-04-01 DIAGNOSIS — M2560 Stiffness of unspecified joint, not elsewhere classified: Secondary | ICD-10-CM

## 2013-04-01 DIAGNOSIS — M4804 Spinal stenosis, thoracic region: Secondary | ICD-10-CM

## 2013-04-03 ENCOUNTER — Encounter: Payer: Worker's Compensation | Admitting: Physical Therapy

## 2013-04-03 DIAGNOSIS — IMO0002 Reserved for concepts with insufficient information to code with codable children: Secondary | ICD-10-CM

## 2013-04-03 DIAGNOSIS — M4804 Spinal stenosis, thoracic region: Secondary | ICD-10-CM

## 2013-04-03 DIAGNOSIS — M6281 Muscle weakness (generalized): Secondary | ICD-10-CM

## 2013-04-03 DIAGNOSIS — M546 Pain in thoracic spine: Secondary | ICD-10-CM

## 2013-04-03 DIAGNOSIS — M2560 Stiffness of unspecified joint, not elsewhere classified: Secondary | ICD-10-CM

## 2016-10-17 ENCOUNTER — Other Ambulatory Visit: Payer: Self-pay | Admitting: Surgical Oncology

## 2016-10-17 DIAGNOSIS — K449 Diaphragmatic hernia without obstruction or gangrene: Secondary | ICD-10-CM

## 2017-01-13 ENCOUNTER — Other Ambulatory Visit: Payer: Self-pay

## 2018-11-11 ENCOUNTER — Emergency Department: Payer: Medicare Other

## 2018-11-11 ENCOUNTER — Other Ambulatory Visit: Payer: Self-pay

## 2018-11-11 ENCOUNTER — Emergency Department
Admission: EM | Admit: 2018-11-11 | Discharge: 2018-11-12 | Disposition: A | Discharge status: Home / Self Care | Payer: Medicare Other | Attending: Emergency Medicine | Admitting: Emergency Medicine

## 2018-11-11 ENCOUNTER — Encounter: Payer: Self-pay | Admitting: Radiology

## 2018-11-11 DIAGNOSIS — I10 Essential (primary) hypertension: Secondary | ICD-10-CM | POA: Diagnosis not present

## 2018-11-11 DIAGNOSIS — R0600 Dyspnea, unspecified: Secondary | ICD-10-CM | POA: Diagnosis present

## 2018-11-11 DIAGNOSIS — Z79899 Other long term (current) drug therapy: Secondary | ICD-10-CM | POA: Insufficient documentation

## 2018-11-11 DIAGNOSIS — Z7982 Long term (current) use of aspirin: Secondary | ICD-10-CM | POA: Insufficient documentation

## 2018-11-11 HISTORY — DX: Essential (primary) hypertension: I10

## 2018-11-11 LAB — COMPREHENSIVE METABOLIC PANEL
ALT: 28 U/L (ref 0–44)
AST: 38 U/L (ref 15–41)
Albumin: 3.4 g/dL — ABNORMAL LOW (ref 3.5–5.0)
Alkaline Phosphatase: 58 U/L (ref 38–126)
Anion gap: 7 (ref 5–15)
BUN: 10 mg/dL (ref 8–23)
CO2: 25 mmol/L (ref 22–32)
Calcium: 8.6 mg/dL — ABNORMAL LOW (ref 8.9–10.3)
Chloride: 104 mmol/L (ref 98–111)
Creatinine, Ser: 0.8 mg/dL (ref 0.61–1.24)
GFR calc Af Amer: >60 mL/min (ref >60)
GFR calc non Af Amer: >60 mL/min (ref >60)
Glucose, Bld: 108 mg/dL — ABNORMAL HIGH (ref 70–99)
Potassium: 4.1 mmol/L (ref 3.5–5.1)
Sodium: 136 mmol/L (ref 135–145)
Total Bilirubin: 0.5 mg/dL (ref 0.3–1.2)
Total Protein: 6.7 g/dL (ref 6.5–8.1)

## 2018-11-11 LAB — CBC WITH DIFFERENTIAL/PLATELET
Abs Immature Granulocytes: 0.05 10*3/uL (ref 0.00–0.07)
Basophils Absolute: 0.1 10*3/uL (ref 0.0–0.1)
Basophils Relative: 1 %
Eosinophils Absolute: 0.5 10*3/uL (ref 0.0–0.5)
Eosinophils Relative: 5 %
HCT: 34.4 % — ABNORMAL LOW (ref 39.0–52.0)
Hemoglobin: 11.2 g/dL — ABNORMAL LOW (ref 13.0–17.0)
Immature Granulocytes: 0 %
Lymphocytes Relative: 13 %
Lymphs Abs: 1.5 10*3/uL (ref 0.7–4.0)
MCH: 31.2 pg (ref 26.0–34.0)
MCHC: 32.6 g/dL (ref 30.0–36.0)
MCV: 95.8 fL (ref 80.0–100.0)
Monocytes Absolute: 0.8 10*3/uL (ref 0.1–1.0)
Monocytes Relative: 7 %
Neutro Abs: 8.3 10*3/uL — ABNORMAL HIGH (ref 1.7–7.7)
Neutrophils Relative %: 74 %
Platelets: 534 10*3/uL — ABNORMAL HIGH (ref 150–400)
RBC: 3.59 MIL/uL — ABNORMAL LOW (ref 4.22–5.81)
RDW: 14.4 % (ref 11.5–15.5)
WBC: 11.2 10*3/uL — ABNORMAL HIGH (ref 4.0–10.5)
nRBC: 0 % (ref 0.0–0.2)

## 2018-11-11 LAB — TROPONIN I: Troponin I: <0.03 ng/mL (ref <0.03)

## 2018-11-11 LAB — BRAIN NATRIURETIC PEPTIDE: B Natriuretic Peptide: 258 pg/mL — ABNORMAL HIGH (ref 0.0–100.0)

## 2018-11-11 MED ORDER — IOHEXOL 350 MG/ML SOLN
100.0000 mL | Freq: Once | INTRAVENOUS | Status: AC | PRN
  Administered 2018-11-11: 100 mL via INTRAVENOUS

## 2018-11-11 NOTE — Discharge Instructions (Addendum)
Return to the emergency room for increased work of breathing, shortness of breath, cough, fever, if you change your mind about admission or you feel worse in any way.  Otherwise, follow closely with your primary care doctor and your surgeon tomorrow.

## 2018-11-11 NOTE — ED Provider Notes (Signed)
[facility] Emergency Department Provider Note  ____________________________________________   I have reviewed the triage vital signs and the nursing notes. Where available I have reviewed prior notes and, if possible and indicated, outside hospital notes.    HISTORY  Chief Complaint Shortness of Breath    HPI Benjamin Shah is a 68 y.o. male presents today complaining of feeling that he cannot quite take a deep breath after surgery.  He had surgery earlier this week for a esophageal issue.  He does not have any abdominal pain he is eating and drinking well.  He has incentive spirometry which she is supposed to be using but not reliably doing so.  His family believe that he is just anxious.  They state that when he starts worrying about his lungs he gets very anxious about them.  He has no personal family history of PE or DVT is not having any chest pain, he does states that sometimes is hard to take a full deep breath.  No cough no fever.  Family strong opinion is that this is anxiety mediated.  He denies any leg swelling or chest pain.   Past Medical History:  Diagnosis Date  . Hypertension     There are no active problems to display for this patient.     Prior to Admission medications   Medication Sig Start Date End Date Taking? Authorizing Provider  acetaminophen (TYLENOL) 500 MG tablet Take 500 mg by mouth every 6 (six) hours as needed.    Yes [provider]  aspirin EC 81 MG tablet Take 81 mg by mouth daily.    Yes [provider]  atorvastatin (LIPITOR) 80 MG tablet Take 80 mg by mouth daily at 6 PM.  09/07/18  Yes [provider]  BRILINTA 90 MG TABS tablet Take 90 mg by mouth 2 (two) times daily.  09/07/18  Yes [provider]  buPROPion (WELLBUTRIN SR) 150 MG 12 hr tablet Take 150 mg by mouth 2 (two) times daily.  09/26/18  Yes [provider]  Diaper Rash Products ([employer] [facility]) OINT  APPLY TO AFFECTED AREA DAILY AS NEEDED 11/04/18  Yes [provider]  furosemide (LASIX) 20 MG tablet Take 20 mg by mouth daily. 06/12/18  Yes [provider]  lisinopril (PRINIVIL,ZESTRIL) 5 MG tablet Take 5 mg by mouth daily.  09/07/18  Yes [provider]  metoprolol succinate (TOPROL-XL) 50 MG 24 hr tablet Take 50 mg by mouth daily.  09/07/18  Yes [provider]  nitroGLYCERIN (NITROSTAT) 0.4 MG SL tablet Place under the tongue. 10/26/17  Yes [provider]  oxyCODONE (OXY IR/ROXICODONE) 5 MG immediate release tablet Take 5 mg by mouth every 4 (four) hours as needed.  11/08/18  Yes [provider]  pantoprazole (PROTONIX) 40 MG tablet Take 40 mg by mouth daily. 11/04/18  Yes [provider]  triamcinolone cream (KENALOG) 0.1 % APPLY TO AFFECTED AREA TWICE A DAY AS NEEDED 11/04/18  Yes [provider]  VENTOLIN HFA 108 (90 Base) MCG/ACT inhaler Inhale 1-2 puffs into the lungs every 4 (four) hours as needed.  07/27/18  Yes [provider]  cephALEXin (KEFLEX) 500 MG capsule TAKE 1 CAPSULE (500 MG TOTAL) BY MOUTH EVERY 12 HOURS FOR 10 DAYS. WHILE SUBMUSCULAR DRAIN IN PLACE. 11/04/18   [provider]    Allergies Codeine  No family history on file.  Social History Social History   Tobacco Use  . Smoking status:  Not on file  Substance Use Topics  . Alcohol use: Not on file  . Drug use: Not on file    Review of Systems Constitutional: No fever/chills Eyes: No visual changes. ENT: No sore throat. No stiff neck no neck pain Cardiovascular: Denies chest pain. Respiratory: Denies shortness of breath. Gastrointestinal:   no vomiting.  No diarrhea.  No constipation. Genitourinary: Negative for dysuria. Musculoskeletal: Negative lower extremity swelling Skin: Negative for rash. Neurological: Negative for severe headaches, focal weakness or  numbness.   ____________________________________________   PHYSICAL EXAM:  VITAL SIGNS: ED Triage Vitals  Enc Vitals Group     BP 11/11/18 2037 114/63     Pulse Rate 11/11/18 2037 (!) 59     Resp 11/11/18 2037 16     Temp 11/11/18 2037 98.2 F (36.8 C)     Temp Source 11/11/18 2037 Oral     SpO2 11/11/18 2037 100 %     Weight 11/11/18 2038 200 lb (90.7 kg)     Height 11/11/18 2038  (1.803 m)     Head Circumference --      Peak Flow --      Pain Score 11/11/18 2038 3     Pain Loc --      Pain Edu? --      Excl. in GC? --     Constitutional: Alert and oriented. Well appearing and in no acute distress.  He is anxious but in no acute distress Eyes: Conjunctivae are normal Head: Atraumatic HEENT: No congestion/rhinnorhea. Mucous membranes are moist.  Oropharynx non-erythematous Neck:   Nontender with no meningismus, no masses, no stridor Cardiovascular: Normal rate, regular rhythm. Grossly normal heart sounds.  Good peripheral circulation. Respiratory: Normal respiratory effort.  No retractions. Lungs CTAB. Abdominal: Soft and nontender. No distention. No guarding no rebound Back:  There is no focal tenderness or step off.  there is no midline tenderness there are no lesions noted. there is no CVA tenderness Musculoskeletal: No lower extremity tenderness, no upper extremity tenderness. No joint effusions, no DVT signs strong distal pulses no edema Neurologic:  Normal speech and language. No gross focal neurologic deficits are appreciated.  Skin:  Skin is warm, dry and intact. No rash noted. Psychiatric: Mood and affect are anxious. Speech and behavior are normal.  ____________________________________________   LABS (all labs ordered are listed, but only abnormal results are displayed)  Labs Reviewed  CBC WITH DIFFERENTIAL/PLATELET - Abnormal; Notable for the following components:      Result Value   WBC 11.2 (*)    RBC 3.59 (*)    Hemoglobin 11.2 (*)    HCT 34.4  (*)    Platelets 534 (*)    Neutro Abs 8.3 (*)    All other components within normal limits  COMPREHENSIVE METABOLIC PANEL - Abnormal; Notable for the following components:   Glucose, Bld 108 (*)    Calcium 8.6 (*)    Albumin 3.4 (*)    All other components within normal limits  BRAIN NATRIURETIC PEPTIDE - Abnormal; Notable for the following components:   B Natriuretic Peptide 258.0 (*)    All other components within normal limits  TROPONIN I    Pertinent labs  results that were available during my care of the patient were reviewed by me and considered in my medical decision making (see chart for details). ____________________________________________  EKG  I personally interpreted any EKGs ordered by me or triage Sinus rhythm rate 60 bpm, no acute ST elevation  or depression normal axis, no acute ischemia. ____________________________________________  RADIOLOGY  Pertinent labs & imaging results that were available during my care of the patient were reviewed by me and considered in my medical decision making (see chart for details). If possible, patient and/or family made aware of any abnormal findings.  Dg Chest 1 View  Result Date: 11/11/2018 CLINICAL DATA:  Shortness of breath EXAM: CHEST  1 VIEW COMPARISON:  July 27, 2018 FINDINGS: There is atelectatic change in each lung base, slightly more on the left than on the right. There is no appreciable edema or consolidation. Heart size and pulmonary vascularity are normal. No adenopathy evident. There is aortic atherosclerosis. There is a sizable hiatal type hernia. No bone lesions. IMPRESSION: Bibasilar atelectasis. Sizable hiatal type hernia. No frank consolidation. Stable cardiac silhouette. Aortic Atherosclerosis (ICD10-I70.0). Electronically Signed   By: Bretta Bang III M.D.   On: 11/11/2018 21:19   Ct Angio Chest Pe W And/or Wo Contrast  Result Date: 11/11/2018 CLINICAL DATA:  Pt states he feels like he is suffocating.  Pt states had back surgery this week. Pt complains of shob "all day". Pt able to speak in full sentences without difficulty. Skin slightly pale. Hx cholecystectomy. EXAM: CT ANGIOGRAPHY CHEST CT ABDOMEN AND PELVIS WITH CONTRAST TECHNIQUE: Multidetector CT imaging of the chest was performed using the standard protocol during bolus administration of intravenous contrast. Multiplanar CT image reconstructions and MIPs were obtained to evaluate the vascular anatomy. Multidetector CT imaging of the abdomen and pelvis was performed using the standard protocol during bolus administration of intravenous contrast. CONTRAST:  OMNIPAQUE IOHEXOL 350 MG/ML SOLN COMPARISON:  07/19/2018 FINDINGS: CTA CHEST FINDINGS Cardiovascular: There is satisfactory opacification of the pulmonary arteries to the segmental level. There is no evidence of a pulmonary embolism. Heart is normal in size and configuration. No pericardial effusion. There are left coronary artery calcifications. Great vessels are normal caliber. Aortic atherosclerotic calcifications are noted from the arch through the descending portion. Mediastinum/Nodes: Prominent to borderline enlarged mediastinal lymph nodes. Right paratracheal, superior hilar node measures 11 mm in short axis. Subcarinal node measures 11 mm in short axis. Left hilar adenopathy, largest node measuring 16 mm in short axis. These lymph nodes are larger than on the prior CT, with a or all subcentimeter. No mediastinal or hilar masses. Trachea is unremarkable. Moderate-sized hiatal hernia. Hernia stomach shows post surgical changes, which are new since the prior CT. Lungs/Pleura: Small right and minimal left pleural effusions. Fluid is seen within the left oblique fissure. There is dependent opacity in both lower lobes consistent with atelectasis. A component of pneumonia is possible but felt less likely. Remainder of the lungs is essentially clear with no evidence of pulmonary edema. Mild  paraseptal emphysema noted at the right apex. No pneumothorax. Musculoskeletal: There defects along the lateral seventh and posterior eighth ribs that appear acute and are likely postsurgical. No other acute fracture. No bone lesions. Review of the MIP images confirms the above findings. CT ABDOMEN and PELVIS FINDINGS Hepatobiliary: No focal liver abnormality is seen. Status post cholecystectomy. No biliary dilatation. Pancreas: Unremarkable. No pancreatic ductal dilatation or surrounding inflammatory changes. Spleen: There is irregular hypoattenuation along the superior medial aspect of the spleen. On the prior CT there is more subtle hypoattenuation in this location. Current finding could reflect a splenic contusion. Could reflect infarct or even irregular home angioma. There is no adjacent hemorrhage to support a laceration. Spleen is normal in size with no other abnormality. Adrenals/Urinary Tract:  Right adrenal mass, low-attenuation, 14-15 mm, unchanged. Normal left adrenal gland. Normal kidneys, ureters and bladder. Stomach/Bowel: Moderate size hiatal hernia. There are anastomosis staples along the herniated portion of the stomach as well as circumferentially at the level of the diaphragmatic hiatus. There is hazy opacity within the adjacent fat. These operative changes are new since the prior exam. Small bowel is normal in caliber with no wall thickening or inflammation. Colon is normal in caliber. There are numerous diverticula mostly along the sigmoid. No diverticulitis, wall thickening or other inflammatory process. Normal appendix visualized. Vascular/Lymphatic: There is fairly extensive aortic atherosclerosis. No aneurysm. No pathologically enlarged lymph nodes. Reproductive: Mild stable enlargement of the prostate gland. Other: No ascites or hemoperitoneum. Musculoskeletal: No fracture or acute finding. No osteoblastic or osteolytic lesions. Review of the MIP images confirms the above findings.  IMPRESSION: CTA CHEST 1. No evidence of a pulmonary embolism. 2. Small right and minimal left pleural effusions. Bilateral lung base opacity most consistent with atelectasis. Pneumonia is possible. No evidence of pulmonary edema. 3. There are postsurgical changes consistent with hiatal hernia surgery, new since the prior CT. Associated defects of the left seventh and eighth ribs appear postsurgical. 4. Prominent to mildly enlarged mediastinal lymph nodes. Enlarged left hilar lymph node with nodes larger than seen on the prior CT. This would support a component of infection in the lungs. 5. No evidence of pulmonary edema. CT ABDOMEN AND PELVIS 1. Hypoattenuation along the superior medial aspect of the spleen. This could reflect a contusion the recent hiatal hernia surgery. However, there is no adjacent hemorrhage. Alternatively it may reflect infarction or even hemangioma. 2. No other evidence of an acute abnormality within the abdomen or pelvis. 3. Stable right adrenal adenoma. 4. Fairly extensive aortic atherosclerosis. 5. Status post cholecystectomy. Numerous colonic diverticula mostly along the sigmoid. No diverticulitis. Electronically Signed   By: Amie Portland M.D.   On: 11/11/2018 23:01   Ct Abdomen Pelvis W Contrast  Result Date: 11/11/2018 CLINICAL DATA:  Pt states he feels like he is suffocating. Pt states had back surgery this week. Pt complains of shob "all day". Pt able to speak in full sentences without difficulty. Skin slightly pale. Hx cholecystectomy. EXAM: CT ANGIOGRAPHY CHEST CT ABDOMEN AND PELVIS WITH CONTRAST TECHNIQUE: Multidetector CT imaging of the chest was performed using the standard protocol during bolus administration of intravenous contrast. Multiplanar CT image reconstructions and MIPs were obtained to evaluate the vascular anatomy. Multidetector CT imaging of the abdomen and pelvis was performed using the standard protocol during bolus administration of intravenous contrast.  CONTRAST:  OMNIPAQUE IOHEXOL 350 MG/ML SOLN COMPARISON:  07/19/2018 FINDINGS: CTA CHEST FINDINGS Cardiovascular: There is satisfactory opacification of the pulmonary arteries to the segmental level. There is no evidence of a pulmonary embolism. Heart is normal in size and configuration. No pericardial effusion. There are left coronary artery calcifications. Great vessels are normal caliber. Aortic atherosclerotic calcifications are noted from the arch through the descending portion. Mediastinum/Nodes: Prominent to borderline enlarged mediastinal lymph nodes. Right paratracheal, superior hilar node measures 11 mm in short axis. Subcarinal node measures 11 mm in short axis. Left hilar adenopathy, largest node measuring 16 mm in short axis. These lymph nodes are larger than on the prior CT, with a or all subcentimeter. No mediastinal or hilar masses. Trachea is unremarkable. Moderate-sized hiatal hernia. Hernia stomach shows post surgical changes, which are new since the prior CT. Lungs/Pleura: Small right and minimal left pleural effusions. Fluid is seen within  the left oblique fissure. There is dependent opacity in both lower lobes consistent with atelectasis. A component of pneumonia is possible but felt less likely. Remainder of the lungs is essentially clear with no evidence of pulmonary edema. Mild paraseptal emphysema noted at the right apex. No pneumothorax. Musculoskeletal: There defects along the lateral seventh and posterior eighth ribs that appear acute and are likely postsurgical. No other acute fracture. No bone lesions. Review of the MIP images confirms the above findings. CT ABDOMEN and PELVIS FINDINGS Hepatobiliary: No focal liver abnormality is seen. Status post cholecystectomy. No biliary dilatation. Pancreas: Unremarkable. No pancreatic ductal dilatation or surrounding inflammatory changes. Spleen: There is irregular hypoattenuation along the superior medial aspect of the spleen. On the prior  CT there is more subtle hypoattenuation in this location. Current finding could reflect a splenic contusion. Could reflect infarct or even irregular home angioma. There is no adjacent hemorrhage to support a laceration. Spleen is normal in size with no other abnormality. Adrenals/Urinary Tract: Right adrenal mass, low-attenuation, 14-15 mm, unchanged. Normal left adrenal gland. Normal kidneys, ureters and bladder. Stomach/Bowel: Moderate size hiatal hernia. There are anastomosis staples along the herniated portion of the stomach as well as circumferentially at the level of the diaphragmatic hiatus. There is hazy opacity within the adjacent fat. These operative changes are new since the prior exam. Small bowel is normal in caliber with no wall thickening or inflammation. Colon is normal in caliber. There are numerous diverticula mostly along the sigmoid. No diverticulitis, wall thickening or other inflammatory process. Normal appendix visualized. Vascular/Lymphatic: There is fairly extensive aortic atherosclerosis. No aneurysm. No pathologically enlarged lymph nodes. Reproductive: Mild stable enlargement of the prostate gland. Other: No ascites or hemoperitoneum. Musculoskeletal: No fracture or acute finding. No osteoblastic or osteolytic lesions. Review of the MIP images confirms the above findings. IMPRESSION: CTA CHEST 1. No evidence of a pulmonary embolism. 2. Small right and minimal left pleural effusions. Bilateral lung base opacity most consistent with atelectasis. Pneumonia is possible. No evidence of pulmonary edema. 3. There are postsurgical changes consistent with hiatal hernia surgery, new since the prior CT. Associated defects of the left seventh and eighth ribs appear postsurgical. 4. Prominent to mildly enlarged mediastinal lymph nodes. Enlarged left hilar lymph node with nodes larger than seen on the prior CT. This would support a component of infection in the lungs. 5. No evidence of pulmonary edema.  CT ABDOMEN AND PELVIS 1. Hypoattenuation along the superior medial aspect of the spleen. This could reflect a contusion the recent hiatal hernia surgery. However, there is no adjacent hemorrhage. Alternatively it may reflect infarction or even hemangioma. 2. No other evidence of an acute abnormality within the abdomen or pelvis. 3. Stable right adrenal adenoma. 4. Fairly extensive aortic atherosclerosis. 5. Status post cholecystectomy. Numerous colonic diverticula mostly along the sigmoid. No diverticulitis. Electronically Signed   By: Amie Portlandavid  Ormond M.D.   On: 11/11/2018 23:01   ____________________________________________    PROCEDURES  Procedure(s) performed: None  Procedures  Critical Care performed: None  ____________________________________________   INITIAL IMPRESSION / ASSESSMENT AND PLAN / ED COURSE  Pertinent labs & imaging results that were available during my care of the patient were reviewed by me and considered in my medical decision making (see chart for details).  Patient here with a sensation that he cannot take a deep breath but he is able to do so, if asked.  He does not seem to be in any respiratory or physical distress he is somewhat  anxious however given his recent surgery, I did do a CT scan to rule out PE or infection.  There is no definitive pneumonia seen.  He and I talked about antibiotics.  He would prefer not to be on them, and given that he has no evidence of infiltrate on his CT scan, no evidence of fever and he is not having a productive cough not certain that it would be warranted anyway.  He does have a trace of pleural effusions which I do not think are causing him any significant symptoms and likely are reactive from the surgery itself in the surgical site both externally and internally looks quite good.  His white counts not significantly elevated.  Given that he would prefer not to have antibiotics and his sats have been between 97 and 100% with no evidence  of respiratory distress with a reassuring CT scan, I think that think encouraging ongoing incentive spirometry is the best thing for him.  I did offer him admission for observational stay in the hospital but he declines.  There is a lot of media publication about coronavirus etc. and we are in a state of emergency he would prefer not to be exposed to other sick people which I do not think is unreasonable.  He does understand this limits my ability to continue to monitor him.  I do not think there is any evidence of ACS PE or dissection.  And CT scan bears most of this out.  I think serial enzymes are indicated given that the patient has had symptoms since he woke up this morning.  He is much calmer now and after I told him that his CT is negative, he and his family feel that this was all anxiety and they would like to go home.  They will call their surgeon first thing in the morning.  Return precautions and follow-up given and understood.    ____________________________________________   FINAL CLINICAL IMPRESSION(S) / ED DIAGNOSES  Final diagnoses:  None      This chart was dictated using voice recognition software.  Despite best efforts to proofread,  errors can occur which can change meaning.      Jeanmarie Plant, MD 11/11/18 [phone]

## 2018-11-11 NOTE — ED Triage Notes (Addendum)
Pt states he feels like he is suffocating. Pt states had abdominal surgery this week. Pt complains of shob "all day". Pt able to speak in full sentences without difficulty. Skin slightly pale.

## 2018-11-12 NOTE — ED Notes (Signed)
DC pad not working in room at this time.

## 2019-09-05 IMAGING — DX CHEST  1 VIEW
1 series · 1 of 1 positions shown · non-contrast
Comparison: July 27, 2018

CLINICAL DATA: Shortness of breath

EXAM:
CHEST  1 VIEW

[chest ap]
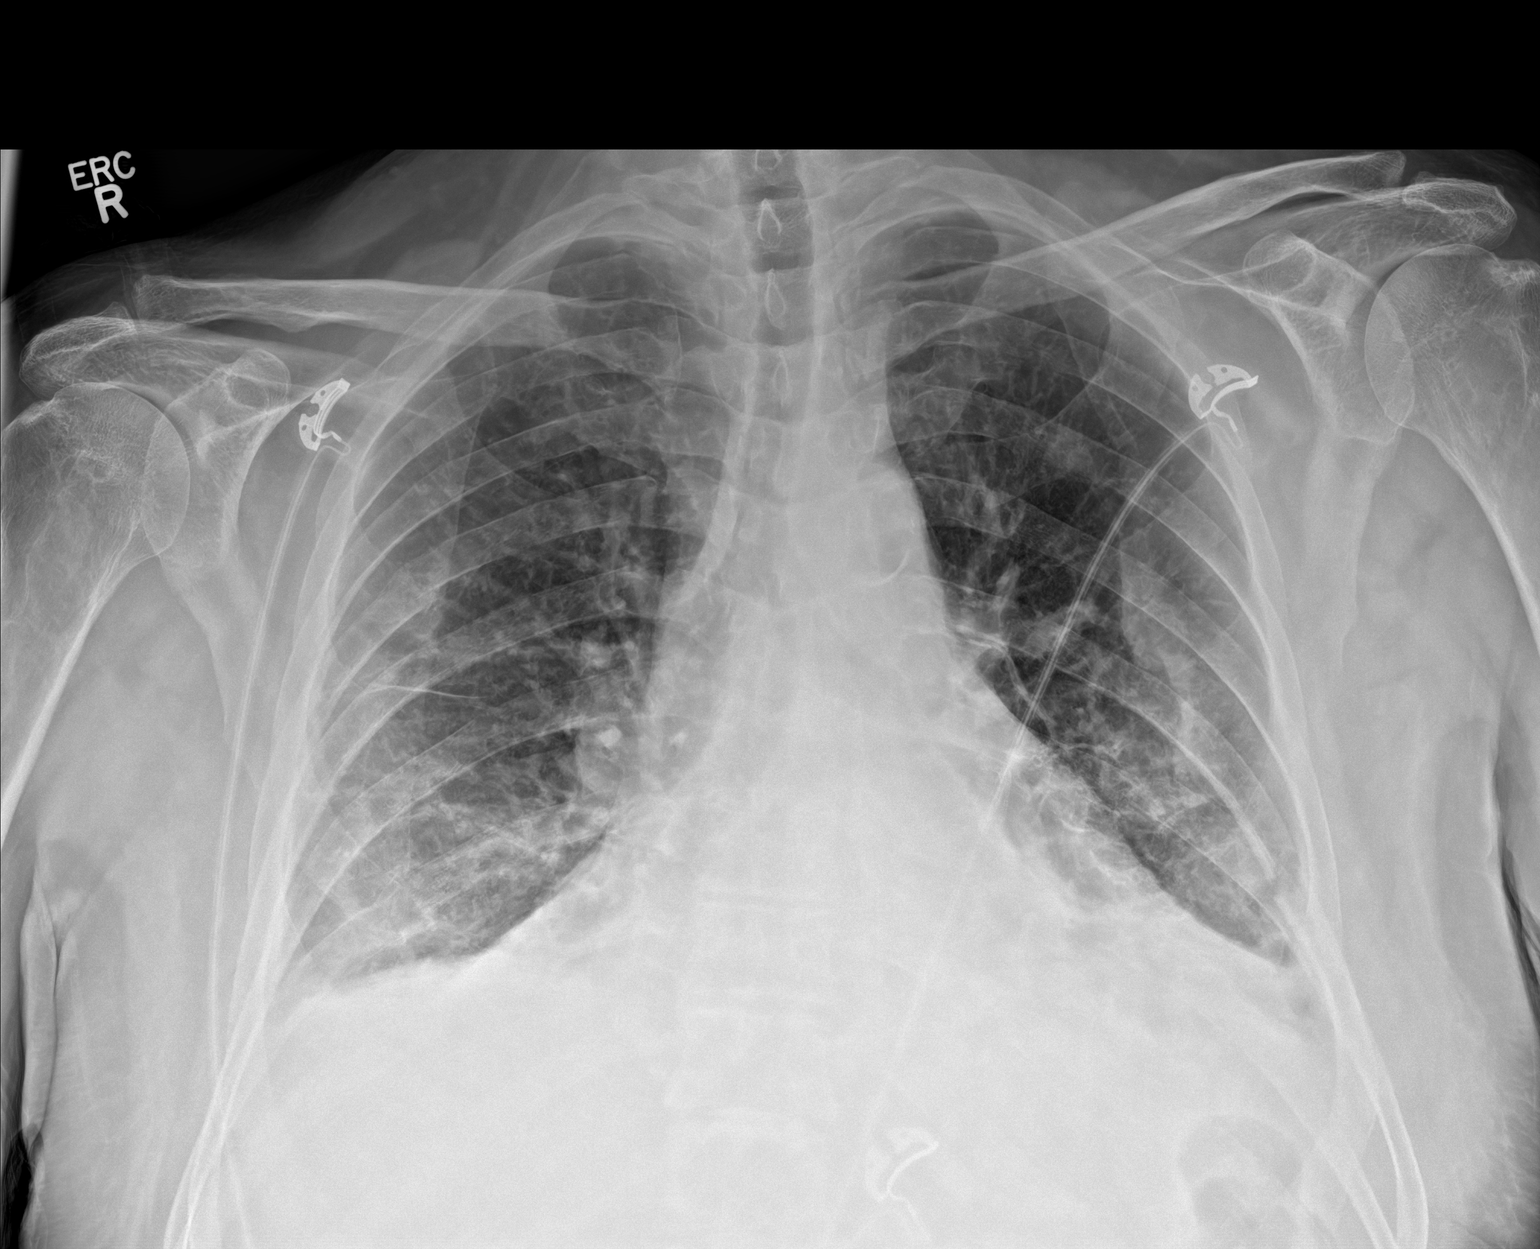

[1 of 1 positions shown; findings below may reference images not displayed]

FINDINGS: There is atelectatic change in each lung base, slightly more on the
left than on the right. There is no appreciable edema or
consolidation. Heart size and pulmonary vascularity are normal. No
adenopathy evident. There is aortic atherosclerosis. There is a
sizable hiatal type hernia. No bone lesions.
IMPRESSION: Bibasilar atelectasis. Sizable hiatal type hernia. No frank
consolidation. Stable cardiac silhouette. Aortic Atherosclerosis
([id number]).

## 2020-02-21 ENCOUNTER — Ambulatory Visit
Admission: EM | Admit: 2020-02-21 | Discharge: 2020-02-21 | Disposition: A | Discharge status: Home / Self Care | Payer: Medicare HMO | Attending: Family Medicine | Admitting: Family Medicine

## 2020-02-21 ENCOUNTER — Other Ambulatory Visit: Payer: Self-pay

## 2020-02-21 DIAGNOSIS — J069 Acute upper respiratory infection, unspecified: Secondary | ICD-10-CM

## 2020-02-21 DIAGNOSIS — J209 Acute bronchitis, unspecified: Secondary | ICD-10-CM

## 2020-02-21 MED ORDER — BENZONATATE 100 MG PO CAPS: 100.0000 mg | ORAL_CAPSULE | Freq: Three times a day (TID) | ORAL | 0 refills | Status: AC

## 2020-02-21 MED ORDER — PREDNISONE 10 MG (21) PO TBPK: ORAL_TABLET | Freq: Every day | ORAL | 0 refills | Status: AC

## 2020-02-21 MED ORDER — AZITHROMYCIN 250 MG PO TABS: 250.0000 mg | ORAL_TABLET | Freq: Every day | ORAL | 0 refills | Status: AC

## 2020-02-21 NOTE — Discharge Instructions (Signed)
I am going to treat you for bronchitis with an upper respiratory infection  I have sent in a steroid taper for you to take.   I have also sent in azithromycin for you to take as well. Take 2 tablets today and then one tablet for the next 4 days  Follow up with this office if you are not beginning to feel better by Monday

## 2020-02-21 NOTE — ED Triage Notes (Signed)
Patient reports productive cough and chest congestion x3 days. Denies concerns for COVID.

## 2020-02-21 NOTE — ED Provider Notes (Signed)
[facility]   [id number] 02/21/20 Arrival Time: 1351   SUBJECTIVE:  Benjamin Shah is a 69 y.o. male who presents with complaint of nasal congestion, post-nasal drainage, and a persistent cough. Onset abrupt approximately 4 days ago. Overall fatigued. SOB: none. Wheezing: mild. OTC treatment: dayquil and nyquil. Social History   Tobacco Use  Smoking Status Not on file    ROS: As per HPI.   OBJECTIVE:  Vitals:   02/21/20 1356  BP: 117/74  Pulse: (!) 55  Resp: 14  Temp: 98.6 F (37 C)  SpO2: 95%     General appearance: alert; no distress HEENT: nasal congestion; clear runny nose; throat irritation secondary to post-nasal drainage Neck: supple without LAD Lungs: diffuse wheezing, productive cough with brown-green sputum Skin: warm and dry Psychological: alert and cooperative; normal mood and affect  Results for orders placed or performed during the hospital encounter of 11/11/18  CBC with Differential  Result Value Ref Range   WBC 11.2 (H) 4.0 - 10.5 K/uL   RBC 3.59 (L) 4.22 - 5.81 MIL/uL   Hemoglobin 11.2 (L) 13.0 - 17.0 g/dL   HCT 76.7 (L) 39 - 52 %   MCV 95.8 80.0 - 100.0 fL   MCH 31.2 26.0 - 34.0 pg   MCHC 32.6 30.0 - 36.0 g/dL   RDW 34.1 93.7 - 90.2 %   Platelets 534 (H) 150 - 400 K/uL   nRBC 0.0 0.0 - 0.2 %   Neutrophils Relative % 74 %   Neutro Abs 8.3 (H) 1.7 - 7.7 K/uL   Lymphocytes Relative 13 %   Lymphs Abs 1.5 0.7 - 4.0 K/uL   Monocytes Relative 7 %   Monocytes Absolute 0.8 0 - 1 K/uL   Eosinophils Relative 5 %   Eosinophils Absolute 0.5 0 - 0 K/uL   Basophils Relative 1 %   Basophils Absolute 0.1 0 - 0 K/uL   Immature Granulocytes 0 %   Abs Immature Granulocytes 0.05 0.00 - 0.07 K/uL  Comprehensive metabolic panel  Result Value Ref Range   Sodium 136 135 - 145 mmol/L   Potassium 4.1 3.5 - 5.1 mmol/L   Chloride 104 98 - 111 mmol/L   CO2 25 22 - 32 mmol/L   Glucose, Bld 108 (H) 70 - 99 mg/dL   BUN 10 8 - 23 mg/dL    Creatinine, Ser 4.09 0.61 - 1.24 mg/dL   Calcium 8.6 (L) 8.9 - 10.3 mg/dL   Total Protein 6.7 6.5 - 8.1 g/dL   Albumin 3.4 (L) 3.5 - 5.0 g/dL   AST 38 15 - 41 U/L   ALT 28 0 - 44 U/L   Alkaline Phosphatase 58 38 - 126 U/L   Total Bilirubin 0.5 0.3 - 1.2 mg/dL   GFR calc non Af Amer >60 >60 mL/min   GFR calc Af Amer >60 >60 mL/min   Anion gap 7 5 - 15  Troponin I - ONCE - STAT  Result Value Ref Range   Troponin I <0.03 <0.03 ng/mL  Brain natriuretic peptide  Result Value Ref Range   B Natriuretic Peptide 258.0 (H) 0.0 - 100.0 pg/mL    Labs Reviewed - No data to display  No results found.  Allergies  Allergen Reactions  . Codeine Nausea And Vomiting and Other (See Comments)    Feel drunk "makes me feel drunk for a couple of days" Feel drunk     Past Medical History:  Diagnosis Date  . Hypertension  Social History   Socioeconomic History  . Marital status: Married    Spouse name: Not on file  . Number of children: Not on file  . Years of education: Not on file  . Highest education level: Not on file  Occupational History  . Not on file  Tobacco Use  . Smoking status: Not on file  Substance and Sexual Activity  . Alcohol use: Not on file  . Drug use: Not on file  . Sexual activity: Not on file  Other Topics Concern  . Not on file  Social History Narrative  . Not on file   Social Determinants of Health   Financial Resource Strain:   . Difficulty of Paying Living Expenses:   Food Insecurity:   . Worried About Charity fundraiser in the Last Year:   . Arboriculturist in the Last Year:   Transportation Needs:   . Film/video editor (Medical):   Marland Kitchen Lack of Transportation (Non-Medical):   Physical Activity:   . Days of Exercise per Week:   . Minutes of Exercise per Session:   Stress:   . Feeling of Stress :   Social Connections:   . Frequency of Communication with Friends and Family:   . Frequency of Social Gatherings with Friends and Family:   .  Attends Religious Services:   . Active Member of Clubs or Organizations:   . Attends Archivist Meetings:   Marland Kitchen Marital Status:   Intimate Partner Violence:   . Fear of Current or Ex-Partner:   . Emotionally Abused:   Marland Kitchen Physically Abused:   . Sexually Abused:    History reviewed. No pertinent family history.   ASSESSMENT & PLAN:  1. Acute bronchitis, unspecified organism   2. URI, acute     Meds ordered this encounter  Medications  . predniSONE (STERAPRED UNI-PAK 21 TAB) 10 MG (21) TBPK tablet    Sig: Take by mouth daily for 6 days. Take 6 tablets on day 1, 5 tablets on day 2, 4 tablets on day 3, 3 tablets on day 4, 2 tablets on day 5, 1 tablet on day 6    Dispense:  21 tablet    Refill:  0    Order Specific Question:   Supervising Provider    Answer:   Chase Picket [id number]  . azithromycin (ZITHROMAX) 250 MG tablet    Sig: Take 1 tablet (250 mg total) by mouth daily. Take first 2 tablets together, then 1 every day until finished.    Dispense:  6 tablet    Refill:  0    Order Specific Question:   Supervising Provider    Answer:   Chase Picket [id number]  . benzonatate (TESSALON) 100 MG capsule    Sig: Take 1 capsule (100 mg total) by mouth every 8 (eight) hours.    Dispense:  21 capsule    Refill:  0    Order Specific Question:   Supervising Provider    Answer:   Chase Picket [id number]    Acute Bronchitis Acute URI  Will treat with above therapy given duration of symptoms.   OTC symptom care as needed. Will plan f/u with PCP or here as needed.  Reviewed expectations re: course of current medical issues. Questions answered. Outlined signs and symptoms indicating need for more acute intervention. Patient verbalized understanding. After Visit Summary given.            Faustino Congress, NP 02/21/20  1423  

## 2022-12-19 ENCOUNTER — Other Ambulatory Visit: Payer: Self-pay | Admitting: Student in an Organized Health Care Education/Training Program

## 2022-12-19 DIAGNOSIS — M519 Unspecified thoracic, thoracolumbar and lumbosacral intervertebral disc disorder: Secondary | ICD-10-CM

## 2023-01-04 ENCOUNTER — Ambulatory Visit
Admission: RE | Admit: 2023-01-04 | Discharge: 2023-01-04 | Disposition: A | Discharge status: Home / Self Care | Payer: Medicare Other | Source: Ambulatory Visit | Attending: Student in an Organized Health Care Education/Training Program | Admitting: Student in an Organized Health Care Education/Training Program

## 2023-01-04 DIAGNOSIS — M519 Unspecified thoracic, thoracolumbar and lumbosacral intervertebral disc disorder: Secondary | ICD-10-CM

## 2023-05-15 ENCOUNTER — Other Ambulatory Visit: Payer: Self-pay | Admitting: Orthopaedic Surgery

## 2023-05-15 DIAGNOSIS — M5414 Radiculopathy, thoracic region: Secondary | ICD-10-CM

## 2023-05-17 ENCOUNTER — Other Ambulatory Visit: Payer: Medicare Other

## 2023-05-17 ENCOUNTER — Ambulatory Visit
Admission: RE | Admit: 2023-05-17 | Discharge: 2023-05-17 | Disposition: A | Discharge status: Home / Self Care | Payer: Medicare Other | Source: Ambulatory Visit | Attending: Orthopaedic Surgery | Admitting: Orthopaedic Surgery

## 2023-05-17 DIAGNOSIS — M5414 Radiculopathy, thoracic region: Secondary | ICD-10-CM
